# Patient Record
Sex: Female | Born: 1985 | Race: Black or African American | Hispanic: No | Marital: Single | Smoking: Former smoker
Health system: Southern US, Community
[De-identification: ages and names within clinical notes are randomized; demographics above are authoritative.]

## PROBLEM LIST (undated history)

## (undated) HISTORY — PX: TONSILLECTOMY: SUR1361

---
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
## 2007-10-29 ENCOUNTER — Ambulatory Visit (HOSPITAL_COMMUNITY): Admission: RE | Admit: 2007-10-29 | Discharge: 2007-10-29 | Payer: Self-pay | Admitting: Obstetrics and Gynecology

## 2007-11-19 ENCOUNTER — Ambulatory Visit (HOSPITAL_COMMUNITY): Admission: RE | Admit: 2007-11-19 | Discharge: 2007-11-19 | Payer: Self-pay | Admitting: Obstetrics and Gynecology

## 2007-12-09 ENCOUNTER — Ambulatory Visit (HOSPITAL_COMMUNITY): Admission: RE | Admit: 2007-12-09 | Discharge: 2007-12-09 | Payer: Self-pay | Admitting: Obstetrics and Gynecology

## 2008-01-06 ENCOUNTER — Ambulatory Visit (HOSPITAL_COMMUNITY): Admission: RE | Admit: 2008-01-06 | Discharge: 2008-01-06 | Payer: Self-pay | Admitting: Obstetrics and Gynecology

## 2008-03-01 ENCOUNTER — Ambulatory Visit (HOSPITAL_COMMUNITY): Admission: RE | Admit: 2008-03-01 | Discharge: 2008-03-01 | Payer: Self-pay | Admitting: Obstetrics and Gynecology

## 2010-11-03 ENCOUNTER — Encounter: Payer: Self-pay | Admitting: Obstetrics and Gynecology

## 2013-05-15 ENCOUNTER — Emergency Department (HOSPITAL_BASED_OUTPATIENT_CLINIC_OR_DEPARTMENT_OTHER): Payer: No Typology Code available for payment source

## 2013-05-15 ENCOUNTER — Encounter (HOSPITAL_BASED_OUTPATIENT_CLINIC_OR_DEPARTMENT_OTHER): Payer: Self-pay | Admitting: *Deleted

## 2013-05-15 ENCOUNTER — Emergency Department (HOSPITAL_BASED_OUTPATIENT_CLINIC_OR_DEPARTMENT_OTHER)
Admission: EM | Admit: 2013-05-15 | Discharge: 2013-05-15 | Disposition: A | Discharge status: Home / Self Care | Payer: No Typology Code available for payment source | Attending: Emergency Medicine | Admitting: Emergency Medicine

## 2013-05-15 DIAGNOSIS — Y939 Activity, unspecified: Secondary | ICD-10-CM | POA: Insufficient documentation

## 2013-05-15 DIAGNOSIS — Y92410 Unspecified street and highway as the place of occurrence of the external cause: Secondary | ICD-10-CM | POA: Insufficient documentation

## 2013-05-15 DIAGNOSIS — S8391XA Sprain of unspecified site of right knee, initial encounter: Secondary | ICD-10-CM

## 2013-05-15 DIAGNOSIS — F17200 Nicotine dependence, unspecified, uncomplicated: Secondary | ICD-10-CM | POA: Insufficient documentation

## 2013-05-15 DIAGNOSIS — IMO0002 Reserved for concepts with insufficient information to code with codable children: Secondary | ICD-10-CM | POA: Insufficient documentation

## 2013-05-15 DIAGNOSIS — S0990XA Unspecified injury of head, initial encounter: Secondary | ICD-10-CM | POA: Insufficient documentation

## 2013-05-15 MED ORDER — HYDROCODONE-ACETAMINOPHEN 5-325 MG PO TABS: 2.0000 | ORAL_TABLET | ORAL | Status: DC | PRN

## 2013-05-15 MED ORDER — HYDROCODONE-ACETAMINOPHEN 5-325 MG PO TABS
2.0000 | ORAL_TABLET | Freq: Once | ORAL | Status: AC
  Administered 2013-05-15: 2 via ORAL
  Filled 2013-05-15: qty 2

## 2013-05-15 NOTE — ED Notes (Signed)
Patient transported to X-ray 
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
## 2013-05-16 NOTE — ED Provider Notes (Signed)
  Medical screening examination/treatment/procedure(s) were performed by non-physician practitioner and as supervising physician I was immediately available for consultation/collaboration.    Gerhard Munch, MD 05/16/13 0000

## 2013-06-03 NOTE — ED Provider Notes (Signed)
  Medical screening examination/treatment/procedure(s) were performed by non-physician practitioner and as supervising physician I was immediately available for consultation/collaboration.    Gerhard Munch, MD 06/03/13 0005

## 2013-12-03 ENCOUNTER — Emergency Department (HOSPITAL_BASED_OUTPATIENT_CLINIC_OR_DEPARTMENT_OTHER)
Admission: EM | Admit: 2013-12-03 | Discharge: 2013-12-03 | Disposition: A | Discharge status: Home / Self Care | Payer: No Typology Code available for payment source | Attending: Emergency Medicine | Admitting: Emergency Medicine

## 2013-12-03 ENCOUNTER — Encounter (HOSPITAL_BASED_OUTPATIENT_CLINIC_OR_DEPARTMENT_OTHER): Payer: Self-pay | Admitting: Emergency Medicine

## 2013-12-03 DIAGNOSIS — F17200 Nicotine dependence, unspecified, uncomplicated: Secondary | ICD-10-CM | POA: Insufficient documentation

## 2013-12-03 DIAGNOSIS — J111 Influenza due to unidentified influenza virus with other respiratory manifestations: Secondary | ICD-10-CM

## 2013-12-03 LAB — RAPID STREP SCREEN (MED CTR MEBANE ONLY): STREPTOCOCCUS, GROUP A SCREEN (DIRECT): NEGATIVE

## 2013-12-03 MED ORDER — IBUPROFEN 800 MG PO TABS
800.0000 mg | ORAL_TABLET | Freq: Once | ORAL | Status: AC
  Administered 2013-12-03: 800 mg via ORAL

## 2013-12-03 MED ORDER — HYDROCODONE-ACETAMINOPHEN 7.5-325 MG/15ML PO SOLN: ORAL | Status: DC

## 2013-12-03 MED ORDER — IBUPROFEN 800 MG PO TABS
ORAL_TABLET | ORAL | Status: AC
  Filled 2013-12-03: qty 1

## 2013-12-03 MED ORDER — GUAIFENESIN 100 MG/5ML PO LIQD: 100.0000 mg | ORAL | Status: DC | PRN

## 2013-12-03 MED ORDER — OSELTAMIVIR PHOSPHATE 75 MG PO CAPS: 75.0000 mg | ORAL_CAPSULE | Freq: Two times a day (BID) | ORAL | Status: DC

## 2013-12-03 NOTE — Discharge Instructions (Signed)

## 2013-12-03 NOTE — ED Provider Notes (Signed)
Medical screening examination/treatment/procedure(s) were performed by non-physician practitioner and as supervising physician I was immediately available for consultation/collaboration.  EKG Interpretation   None         Harvie Morua, MD 12/03/13 2244 

## 2013-12-03 NOTE — ED Notes (Signed)
C/o sore throat, fever, body aches, one episode of vomiting, congestion.  Daughter has been diagnosed with the flu.

## 2013-12-03 NOTE — ED Provider Notes (Signed)
CSN: [id number]     Arrival date & time 12/03/13  1809 History   First MD Initiated Contact with Patient 12/03/13 1911     Chief Complaint  Patient presents with  . Sore Throat, Body Aches      (Consider location/radiation/quality/duration/timing/severity/associated sxs/prior Treatment) HPI  28 year old female presents with flulike symptoms. Patient reports for the past 2 days she experiencing fever, chills, headache, sore throat, nasal congestion, sneezing, cough, body aches, and episodes of vomiting after persistent coughing. Patient is taking Goody powder and Tylenol with some relief. Patient states her daughter was recently diagnosed with flu with a positive flu test and receive Tamiflu which helps with the symptoms. States her son in the mom was diagnosed with positive influenza as well. She is the only one that did not take any TamiFlu.  She denies any recent travel. Denies any neck stiffness, shortness of breath, hemoptysis, abdominal pain, dysuria, or rash.  History reviewed. No pertinent past medical history. Past Surgical History  Procedure Laterality Date  . Tonsillectomy     No family history on file. History  Substance Use Topics  . Smoking status: Current Every Day Smoker  . Smokeless tobacco: Not on file  . Alcohol Use: No   OB History   Grav Para Term Preterm Abortions TAB SAB Ect Mult Living                 Review of Systems  All other systems reviewed and are negative.      Allergies  Review of patient's allergies indicates no known allergies.  Home Medications   Current Outpatient Rx  Name  Route  Sig  Dispense  Refill  . HYDROcodone-acetaminophen (NORCO/VICODIN) 5-325 MG per tablet   Oral   Take 2 tablets by mouth every 4 (four) hours as needed.   16 tablet   0    BP 132/83  Pulse 108  Temp(Src) 100.8 F (38.2 C) (Oral)  Resp 20  Ht 5\' 8"  (1.727 m)  Wt 240 lb (108.863 kg)  BMI 36.50 kg/m2  SpO2 99%  LMP 11/09/2013 Physical Exam   Nursing note and vitals reviewed. Constitutional: She is oriented to person, place, and time. She appears well-developed and well-nourished. No distress.  Awake, alert, nontoxic appearance  HENT:  Head: Atraumatic.  Right Ear: External ear normal.  Left Ear: External ear normal.  Nose: Nose normal.  Mouth/Throat: Oropharynx is clear and moist.  Eyes: Conjunctivae are normal. Right eye exhibits no discharge. Left eye exhibits no discharge.  Neck: Neck supple.  Cardiovascular: Normal rate and regular rhythm.   Pulmonary/Chest: Effort normal. No respiratory distress. She has no wheezes. She has no rales. She exhibits no tenderness.  Abdominal: Soft. There is no tenderness. There is no rebound.  Musculoskeletal: She exhibits no tenderness.  Neurological: She is alert and oriented to person, place, and time.  Skin: No rash noted.  Psychiatric: She has a normal mood and affect.    ED Course  Procedures (including critical care time)  Patient here with flulike symptoms. Considering that patient has recent sick contact with positive flu test and she is within the 48-hour window, will prescribe Tamiflu along with guaifenesin and Hycet cough medication. Work note provided.  Labs Review Labs Reviewed  RAPID STREP SCREEN  CULTURE, GROUP A STREP   Imaging Review No results found.  EKG Interpretation   None       MDM   Final diagnoses:  Influenza    BP 118/60  Pulse  94  Temp(Src) 100.7 F (38.2 C) (Oral)  Resp 16  Ht 5\' 8"  (1.727 m)  Wt 240 lb (108.863 kg)  BMI 36.50 kg/m2  SpO2 100%  LMP 11/09/2013     Fayrene HelperBowie Gillian Meeuwsen, PA-C 12/03/13 2042

## 2013-12-05 LAB — CULTURE, GROUP A STREP

## 2018-07-14 ENCOUNTER — Encounter (HOSPITAL_BASED_OUTPATIENT_CLINIC_OR_DEPARTMENT_OTHER): Payer: Self-pay | Admitting: *Deleted

## 2018-07-14 ENCOUNTER — Emergency Department (HOSPITAL_BASED_OUTPATIENT_CLINIC_OR_DEPARTMENT_OTHER)
Admission: EM | Admit: 2018-07-14 | Discharge: 2018-07-15 | Disposition: A | Discharge status: Home / Self Care | Payer: Medicaid Other | Attending: Emergency Medicine | Admitting: Emergency Medicine

## 2018-07-14 ENCOUNTER — Emergency Department (HOSPITAL_BASED_OUTPATIENT_CLINIC_OR_DEPARTMENT_OTHER): Payer: Medicaid Other

## 2018-07-14 ENCOUNTER — Emergency Department (HOSPITAL_COMMUNITY): Payer: Medicaid Other

## 2018-07-14 ENCOUNTER — Other Ambulatory Visit: Payer: Self-pay

## 2018-07-14 DIAGNOSIS — F17210 Nicotine dependence, cigarettes, uncomplicated: Secondary | ICD-10-CM | POA: Diagnosis not present

## 2018-07-14 DIAGNOSIS — N939 Abnormal uterine and vaginal bleeding, unspecified: Secondary | ICD-10-CM

## 2018-07-14 DIAGNOSIS — R102 Pelvic and perineal pain: Secondary | ICD-10-CM | POA: Insufficient documentation

## 2018-07-14 NOTE — ED Provider Notes (Signed)
[facility] EMERGENCY DEPARTMENT Provider Note   CSN: [id number] Arrival date & time: 07/14/18  2045     History   Chief Complaint Chief Complaint  Patient presents with  . Vaginal Pain    HPI Latasha Lee is a 32 y.o. female.  32 y/o female G2P3 with no PMH presents to the ED with a chief complaint of vaginal bleeding and pain x since this afternoon.  States she has had vaginal bleeding for the past couple of months and she was seen by an OB/GYN who actually place patient on medication however patient states that this medication was making her bleed even more so she stopped the medication.  Patient had a ParaGard IUD placed 9 years ago and reports this IUD fell out today when she went to pull out her tampon while she was pain in the toilet at work.  She also endorses some cramping all along her lower abdomen, patient has taken Tylenol for relieve and states this has help with her cramps.  She was seen by an OB/GYN back in July who advised her that her vaginal bleeding will improve with time.  She states she is very alarmed as her IUD fell out and there were clots on the IUD when they came out.  She denies any fever, vaginal discharge, shortness of breath or chest pain.     History reviewed. No pertinent past medical history.  There are no active problems to display for this patient.   Past Surgical History:  Procedure Laterality Date  . TONSILLECTOMY       OB History   None      Home Medications    Prior to Admission medications   Not on File    Family History History reviewed. No pertinent family history.  Social History Social History   Tobacco Use  . Smoking status: Current Every Day Smoker    Packs/day: 0.50  . Smokeless tobacco: Never Used  Substance Use Topics  . Alcohol use: No  . Drug use: No     Allergies   Patient has no known allergies.   Review of Systems Review of Systems  Constitutional: Negative for fever.    Gastrointestinal: Negative for abdominal pain and vomiting.  Genitourinary: Positive for vaginal bleeding and vaginal pain. Negative for dysuria, flank pain and vaginal discharge.  All other systems reviewed and are negative.    Physical Exam Updated Vital Signs BP 134/84 (BP Location: Right Arm)   Pulse 72   Temp 98 F (36.7 C) (Oral)   Resp 18   Ht 5\' 7"  (1.702 m)   Wt (!) 137 kg   LMP 05/09/2018   SpO2 98%   BMI 47.30 kg/m   Physical Exam  Constitutional: She is oriented to person, place, and time. She appears well-developed and well-nourished.  HENT:  Head: Normocephalic and atraumatic.  Neck: Normal range of motion. Neck supple.  Cardiovascular: Normal heart sounds.  Pulmonary/Chest: Breath sounds normal.  Abdominal: Soft. There is tenderness (lower region).  Musculoskeletal: She exhibits no tenderness.  Neurological: She is alert and oriented to person, place, and time.  Skin: Skin is warm and dry.  Nursing note and vitals reviewed.    ED Treatments / Results  Labs (all labs ordered are listed, but only abnormal results are displayed) Labs Reviewed - No data to display  EKG None  Radiology US Pelvis Transvanginal Non-ob (tv Only)  Result Date: 07/14/2018 CLINICAL DATA:  Vaginal pain status post unintentional IUD removal  EXAM: ULTRASOUND PELVIS TRANSVAGINAL TECHNIQUE: Transvaginal ultrasound examination of the pelvis was performed including evaluation of the uterus, ovaries, adnexal regions, and pelvic cul-de-sac. COMPARISON:  None. FINDINGS: Uterus Measurements: 8.2 x 4.4 x 5.0 cm. No fibroids or other mass visualized. Endometrium Thickness: 13 mm.  No focal abnormality visualized. Right ovary Measurements: 3.6 x 2.2 x 2.2 cm. Normal appearance/no adnexal mass. Left ovary Measurements: 2.9 x 2.3 x 2.3 cm. Normal appearance/no adnexal mass. Other findings:  No abnormal free fluid IMPRESSION: Negative pelvic ultrasound. Electronically Signed   By: Charline Bills  M.D.   On: 07/14/2018 23:33    Procedures Procedures (including critical care time)  Medications Ordered in ED Medications - No data to display   Initial Impression / Assessment and Plan / ED Course  I have reviewed the triage vital signs and the nursing notes.  Pertinent labs & imaging results that were available during my care of the patient were reviewed by me and considered in my medical decision making (see chart for details).     She presents with vaginal bleeding and vaginal pain.  Patient used to bathroom today and states while she was urinating and removing her tampon she saw her IUD come out.  Patient showed me a picture of her IUD cover and clots at this time.  Patient is afraid that she might have perforated or have some irregular issues with her cervix and uterus.  Patient's mother at the bedside requesting ultrasound to rule out any acute abnormalities.  Patient states that she is been bleeding for a couple months but states that her GYN placed her on a medication which actually decrease her bleeding and then increased it, she has stopped taking his medication as of now.  Ultrasound pelvis transvaginal showed ovaries are within normal limits, there is no adnexal masses, no acute abnormality at this time.  Negative pelvic ultrasound  At this time I spoken to patient and ask her to move up her appointment to her gynecologist but she reports she has an appointment with him in October 17.  Patient will continue to take ibuprofen or Aleve for her pain.  Return precautions have been discussed with patient and mother at the bedside at length.  Vitals stable during ED visit, patient stable for discharge.  Final Clinical Impressions(s) / ED Diagnoses   Final diagnoses:  Vaginal pain  Vaginal bleeding    ED Discharge Orders    None       Claude Manges, Cordelia Poche 07/14/18 2349    Raeford Razor, MD 07/19/18 [phone]

## 2018-07-14 NOTE — ED Triage Notes (Signed)
Pt c/o vaginal pain after IUD fell out x 1 hr ago after taking out tampon

## 2018-07-14 NOTE — Discharge Instructions (Addendum)
Ultrasound today was normal.  Please follow up with GYN, you may call tomorrow in order to try moving your appointment to sooner date.

## 2019-05-12 IMAGING — US US TRANSVAGINAL NON-OB
1 series · 14 of 25 positions shown · non-contrast
Comparison: None.

CLINICAL DATA: Vaginal pain status post unintentional IUD removal

EXAM:
ULTRASOUND PELVIS TRANSVAGINAL
TECHNIQUE: Transvaginal ultrasound examination of the pelvis was performed
including evaluation of the uterus, ovaries, adnexal regions, and
pelvic cul-de-sac.

[Series 1: us transvaginal non-ob · 0.12mm/px · 14 of 32 slices shown]
[im 1/32]
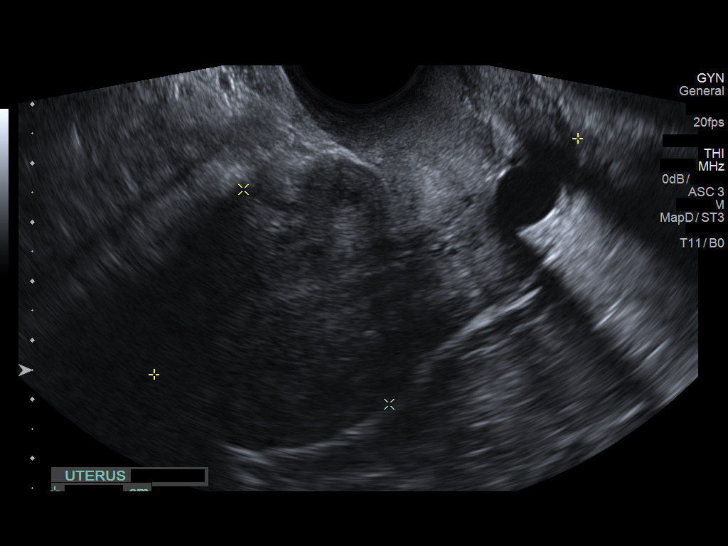
[im 3/32]
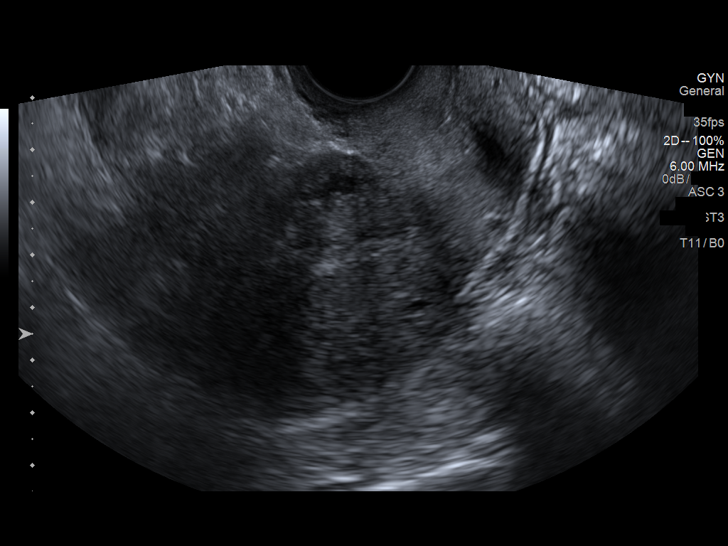
[im 6/32]
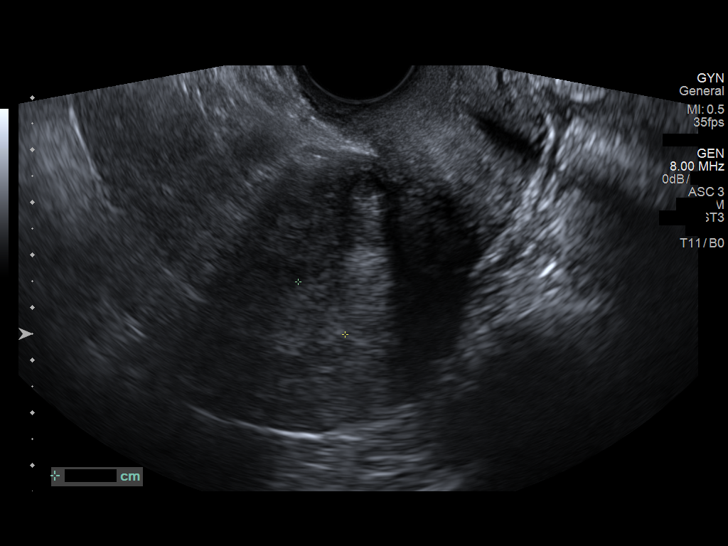
[im 8/32]
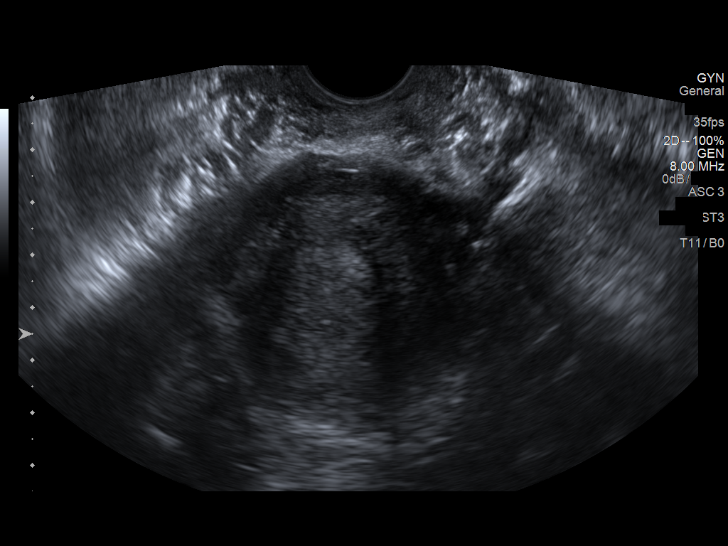
[im 11/32]
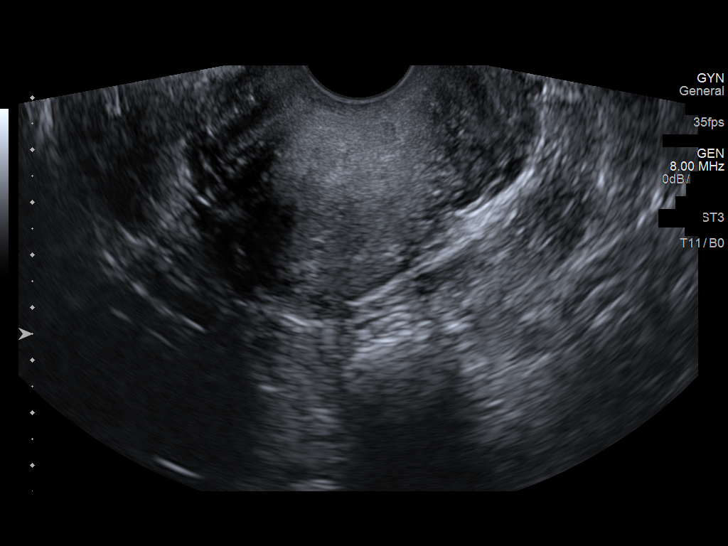
[im 12/32]
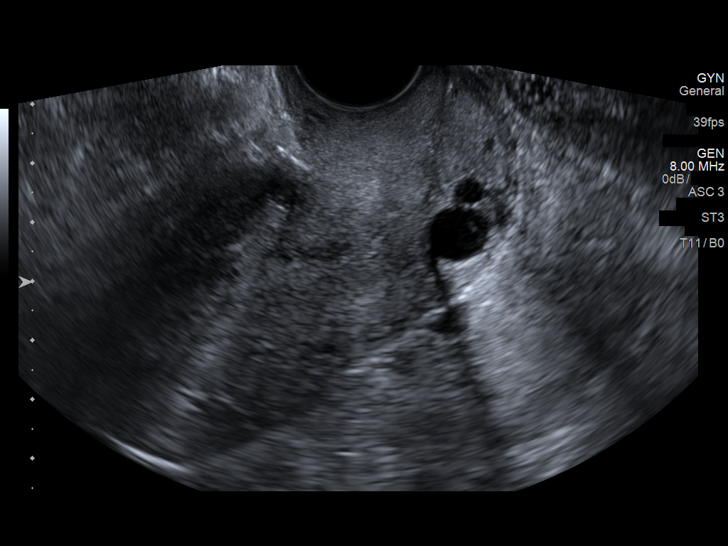
[im 15/32]
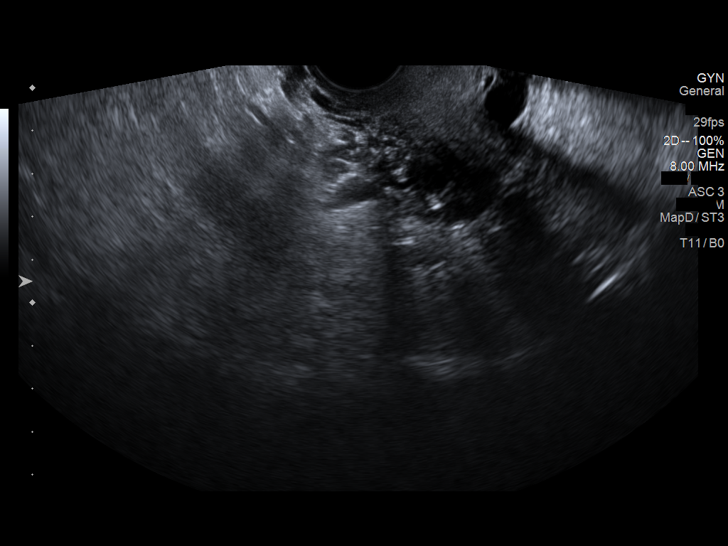
[im 17/32]
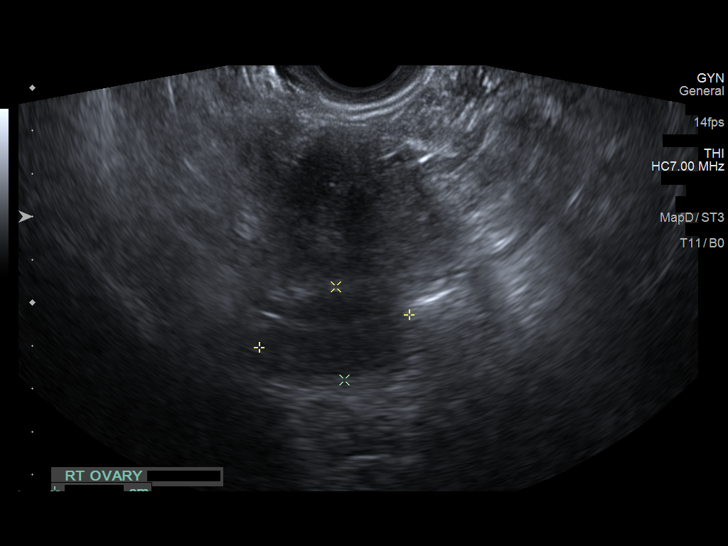
[im 20/32]
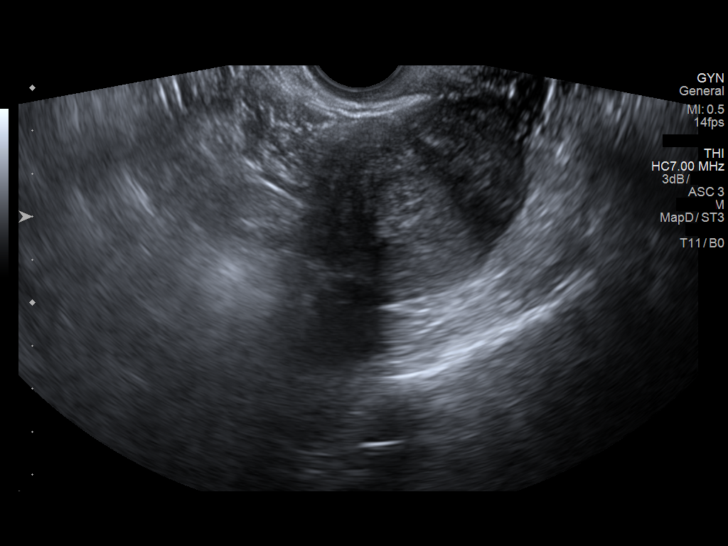
[im 21/32]
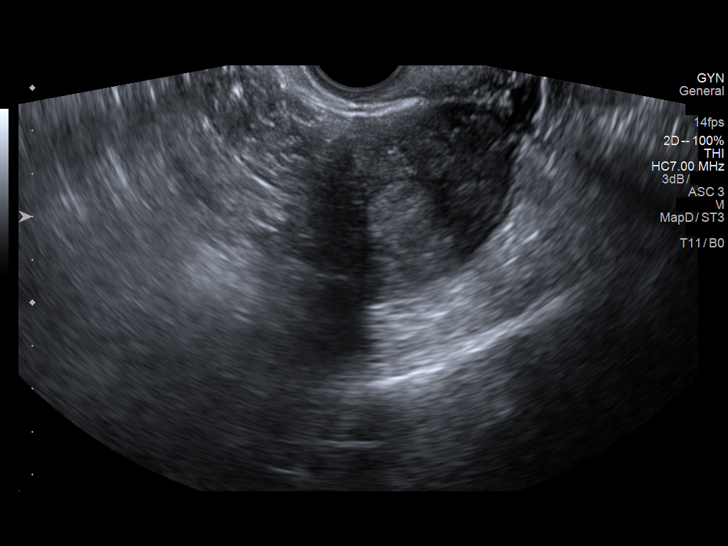
[im 24/32]
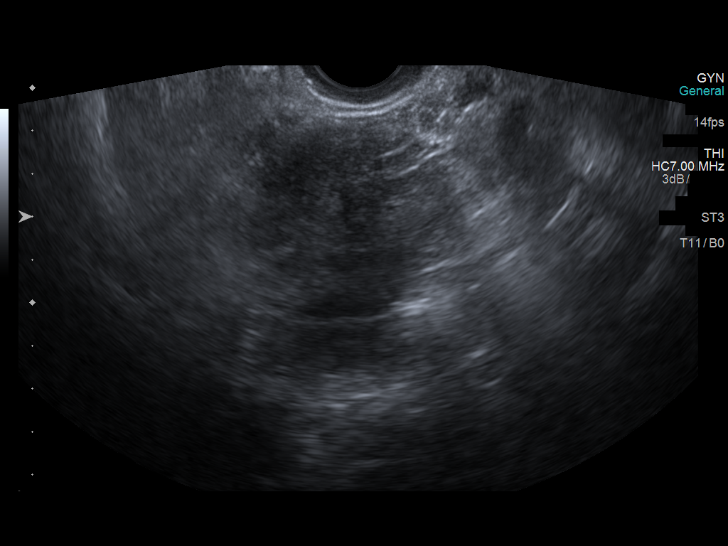
[im 26/32]
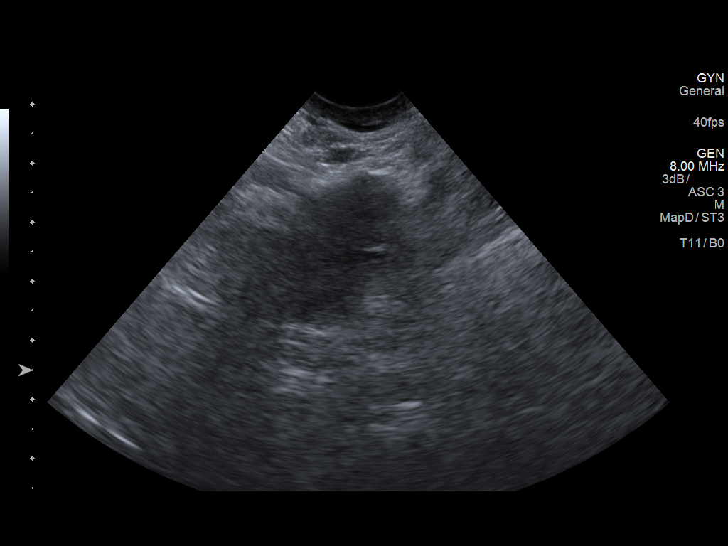
[im 29/32]
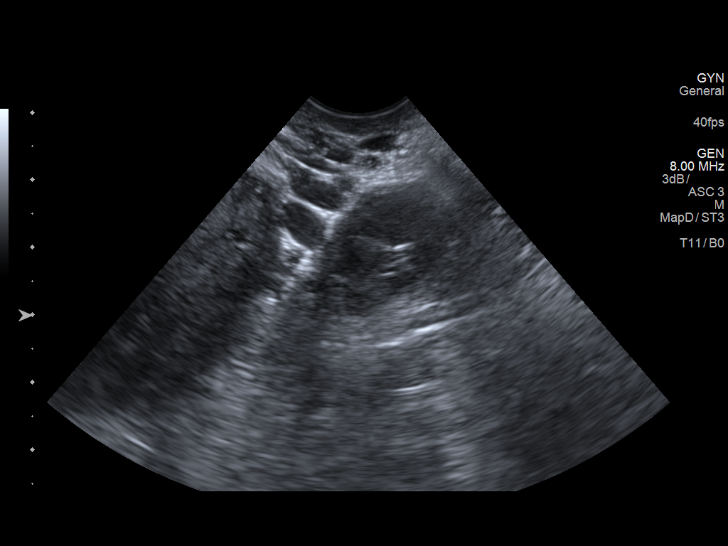
[im 32/32]
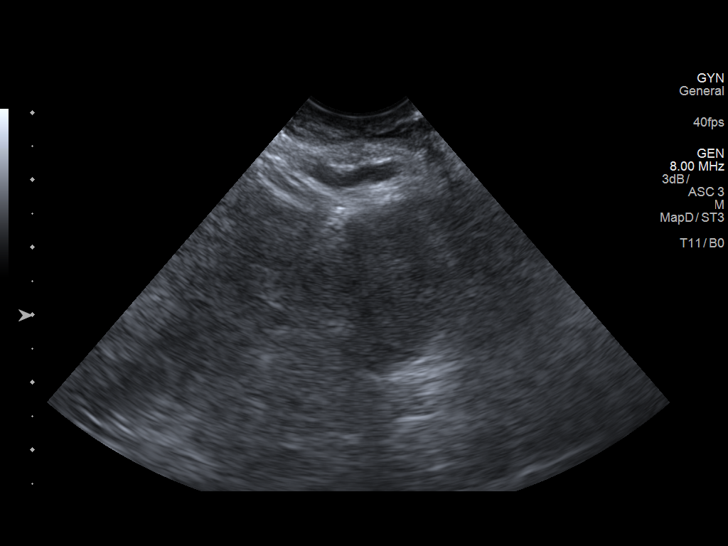

[14 of 25 positions shown; findings below may reference images not displayed]

FINDINGS: Uterus

Measurements: 8.2 x 4.4 x 5.0 cm. No fibroids or other mass
visualized.

Endometrium

Thickness: 13 mm.  No focal abnormality visualized.

Right ovary

Measurements: 3.6 x 2.2 x 2.2 cm. Normal appearance/no adnexal mass.

Left ovary

Measurements: 2.9 x 2.3 x 2.3 cm. Normal appearance/no adnexal mass.

Other findings:  No abnormal free fluid
IMPRESSION: Negative pelvic ultrasound.

## 2019-12-05 ENCOUNTER — Emergency Department (HOSPITAL_BASED_OUTPATIENT_CLINIC_OR_DEPARTMENT_OTHER)
Admission: EM | Admit: 2019-12-05 | Discharge: 2019-12-05 | Disposition: A | Discharge status: Home / Self Care | Payer: Medicaid Other | Attending: Emergency Medicine | Admitting: Emergency Medicine

## 2019-12-05 ENCOUNTER — Encounter (HOSPITAL_BASED_OUTPATIENT_CLINIC_OR_DEPARTMENT_OTHER): Payer: Self-pay | Admitting: *Deleted

## 2019-12-05 ENCOUNTER — Other Ambulatory Visit: Payer: Self-pay

## 2019-12-05 DIAGNOSIS — Z87891 Personal history of nicotine dependence: Secondary | ICD-10-CM | POA: Insufficient documentation

## 2019-12-05 DIAGNOSIS — O26899 Other specified pregnancy related conditions, unspecified trimester: Secondary | ICD-10-CM | POA: Diagnosis not present

## 2019-12-05 DIAGNOSIS — R1030 Lower abdominal pain, unspecified: Secondary | ICD-10-CM | POA: Diagnosis not present

## 2019-12-05 DIAGNOSIS — R109 Unspecified abdominal pain: Secondary | ICD-10-CM

## 2019-12-05 DIAGNOSIS — Z3A35 35 weeks gestation of pregnancy: Secondary | ICD-10-CM | POA: Diagnosis not present

## 2019-12-05 LAB — COMPREHENSIVE METABOLIC PANEL
ALT: 17 U/L (ref 0–44)
AST: 15 U/L (ref 15–41)
Albumin: 3 g/dL — ABNORMAL LOW (ref 3.5–5.0)
Alkaline Phosphatase: 74 U/L (ref 38–126)
Anion gap: 8 (ref 5–15)
BUN: 9 mg/dL (ref 6–20)
CO2: 24 mmol/L (ref 22–32)
Calcium: 9.5 mg/dL (ref 8.9–10.3)
Chloride: 101 mmol/L (ref 98–111)
Creatinine, Ser: 0.62 mg/dL (ref 0.44–1.00)
GFR calc Af Amer: >60 mL/min (ref >60)
GFR calc non Af Amer: >60 mL/min (ref >60)
Glucose, Bld: 90 mg/dL (ref 70–99)
Potassium: 3.7 mmol/L (ref 3.5–5.1)
Sodium: 133 mmol/L — ABNORMAL LOW (ref 135–145)
Total Bilirubin: 0.3 mg/dL (ref 0.3–1.2)
Total Protein: 7.4 g/dL (ref 6.5–8.1)

## 2019-12-05 LAB — CBC WITH DIFFERENTIAL/PLATELET
Abs Immature Granulocytes: 0.05 10*3/uL (ref 0.00–0.07)
Basophils Absolute: 0 10*3/uL (ref 0.0–0.1)
Basophils Relative: 0 %
Eosinophils Absolute: 0.2 10*3/uL (ref 0.0–0.5)
Eosinophils Relative: 2 %
HCT: 32.7 % — ABNORMAL LOW (ref 36.0–46.0)
Hemoglobin: 10.7 g/dL — ABNORMAL LOW (ref 12.0–15.0)
Immature Granulocytes: 1 %
Lymphocytes Relative: 24 %
Lymphs Abs: 1.9 10*3/uL (ref 0.7–4.0)
MCH: 29.3 pg (ref 26.0–34.0)
MCHC: 32.7 g/dL (ref 30.0–36.0)
MCV: 89.6 fL (ref 80.0–100.0)
Monocytes Absolute: 0.8 10*3/uL (ref 0.1–1.0)
Monocytes Relative: 10 %
Neutro Abs: 4.9 10*3/uL (ref 1.7–7.7)
Neutrophils Relative %: 63 %
Platelets: 347 10*3/uL (ref 150–400)
RBC: 3.65 MIL/uL — ABNORMAL LOW (ref 3.87–5.11)
RDW: 13.1 % (ref 11.5–15.5)
WBC: 7.8 10*3/uL (ref 4.0–10.5)
nRBC: 0 % (ref 0.0–0.2)

## 2019-12-05 LAB — URINALYSIS, MICROSCOPIC (REFLEX)

## 2019-12-05 LAB — URINALYSIS, ROUTINE W REFLEX MICROSCOPIC
Bilirubin Urine: NEGATIVE
Glucose, UA: NEGATIVE mg/dL
Hgb urine dipstick: NEGATIVE
Ketones, ur: 40 mg/dL — AB
Leukocytes,Ua: NEGATIVE
Nitrite: NEGATIVE
Protein, ur: 30 mg/dL — AB
Specific Gravity, Urine: >1.03 — ABNORMAL HIGH (ref 1.005–1.030)
pH: 6.5 (ref 5.0–8.0)

## 2019-12-05 MED ORDER — SODIUM CHLORIDE 0.9 % IV BOLUS
1000.0000 mL | Freq: Once | INTRAVENOUS | Status: AC
  Administered 2019-12-05: 1000 mL via INTRAVENOUS

## 2019-12-05 NOTE — ED Notes (Signed)
Latasha Lee OB rapis response notified

## 2019-12-05 NOTE — ED Notes (Signed)
Latasha Lee OB reports cleared by [facility] MD OB attending on call. Pt removed from [facility] monitor

## 2019-12-05 NOTE — ED Notes (Signed)
ED Provider at bedside.  Pelvic exam 

## 2019-12-05 NOTE — ED Provider Notes (Signed)
[facility] Emergency Department Provider Note MRN:  [id number]  Arrival date & time: 12/05/19     Chief Complaint   [redacted] Weeks Pregnant and Abdominal Pain   History of Present Illness   Latasha Lee is a 34 y.o. year-old female with no pertinent past medical history presenting to the ED with chief complaint of abdominal pain.  Location: Diffuse lower pain Duration: 1 hour Onset: Sudden Timing: Constant Description: Squeezing Severity: Mild Exacerbating/Alleviating Factors: None Associated Symptoms: None Pertinent Negatives: Denies vaginal bleeding or leakage of fluid, no fever, no vomiting, no diarrhea, no chest pain, no shortness of breath.   Review of Systems  A complete 10 system review of systems was obtained and all systems are negative except as noted in the HPI and PMH.   Patient's Health History   History reviewed. No pertinent past medical history.  Past Surgical History:  Procedure Laterality Date  . TONSILLECTOMY      No family history on file.  Social History   Socioeconomic History  . Marital status: Single    Spouse name: Not on file  . Number of children: Not on file  . Years of education: Not on file  . Highest education level: Not on file  Occupational History  . Not on file  Tobacco Use  . Smoking status: Former Smoker    Packs/day: 0.50  . Smokeless tobacco: Never Used  Substance and Sexual Activity  . Alcohol use: No  . Drug use: No  . Sexual activity: Not on file  Other Topics Concern  . Not on file  Social History Narrative  . Not on file   Social Determinants of Health   Financial Resource Strain:   . Difficulty of Paying Living Expenses: Not on file  Food Insecurity:   . Worried About Charity fundraiser in the Last Year: Not on file  . Ran Out of Food in the Last Year: Not on file  Transportation Needs:   . Lack of Transportation (Medical): Not on file  . Lack of Transportation (Non-Medical): Not  on file  Physical Activity:   . Days of Exercise per Week: Not on file  . Minutes of Exercise per Session: Not on file  Stress:   . Feeling of Stress : Not on file  Social Connections:   . Frequency of Communication with Friends and Family: Not on file  . Frequency of Social Gatherings with Friends and Family: Not on file  . Attends Religious Services: Not on file  . Active Member of Clubs or Organizations: Not on file  . Attends Archivist Meetings: Not on file  . Marital Status: Not on file  Intimate Partner Violence:   . Fear of Current or Ex-Partner: Not on file  . Emotionally Abused: Not on file  . Physically Abused: Not on file  . Sexually Abused: Not on file     Physical Exam   Vitals:   12/05/19 1910  BP: 131/78  Pulse: 82  Resp: 16  Temp: 98.2 F (36.8 C)  SpO2: 100%    CONSTITUTIONAL: Well-appearing, NAD NEURO:  Alert and oriented x 3, no focal deficits EYES:  eyes equal and reactive ENT/NECK:  no LAD, no JVD CARDIO: Regular rate, well-perfused, normal S1 and S2 PULM:  CTAB no wheezing or rhonchi GI/GU:  normal bowel sounds, non-distended, non-tender; cervical exam reveals a very high and difficult to reach her cervix that is minimally dilated MSK/SPINE:  No gross deformities, no  edema SKIN:  no rash, atraumatic PSYCH:  Appropriate speech and behavior  *Additional and/or pertinent findings included in MDM below  Diagnostic and Interventional Summary    EKG Interpretation  Date/Time:    Ventricular Rate:    PR Interval:    QRS Duration:   QT Interval:    QTC Calculation:   R Axis:     Text Interpretation:        Cardiac Monitoring Interpretation:  Labs Reviewed  URINALYSIS, ROUTINE W REFLEX MICROSCOPIC - Abnormal; Notable for the following components:      Result Value   APPearance CLOUDY (*)    Specific Gravity, Urine >1.030 (*)    Ketones, ur 40 (*)    Protein, ur 30 (*)    All other components within normal limits  CBC WITH  DIFFERENTIAL/PLATELET - Abnormal; Notable for the following components:   RBC 3.65 (*)    Hemoglobin 10.7 (*)    HCT 32.7 (*)    All other components within normal limits  COMPREHENSIVE METABOLIC PANEL - Abnormal; Notable for the following components:   Sodium 133 (*)    Albumin 3.0 (*)    All other components within normal limits  URINALYSIS, MICROSCOPIC (REFLEX) - Abnormal; Notable for the following components:   Bacteria, UA MANY (*)    All other components within normal limits    No orders to display    Medications  sodium chloride 0.9 % bolus 1,000 mL (0 mLs Intravenous Stopped 12/05/19 2014)     Procedures  /  Critical Care Procedures  ED Course and Medical Decision Making  I have reviewed the triage vital signs, the nursing notes, and pertinent available records from the EMR.  Pertinent labs & imaging results that were available during my care of the patient were reviewed by me and considered in my medical decision making (see below for details).     Suspect Braxton Hicks contraction, abdomen is nontender, vitals are stable, will monitor with toco.  Normal fetal heart rate.  Anticipate discharge or transfer to MAU.  8:30 PM update: Patient was evaluated on the toco monitoring for 30 minutes and OB was consulted.  No reason for transfer, patient can be discharged home per OB recommendations.  Urinalysis shows bacteria but many squamous cells, favored to be contaminated, will hold off on treatment at this time.  Elmer Sow. Pilar Plate, MD [facility] Emergency Medicine [facility] [email or web address]  Final Clinical Impressions(s) / ED Diagnoses     ICD-10-CM   1. Abdominal pain, unspecified abdominal location  R10.9     ED Discharge Orders    None       Discharge Instructions Discussed with and Provided to Patient:     Discharge Instructions     You were evaluated in the Emergency Department and after careful evaluation, we did not find any emergent  condition requiring admission or further testing in the hospital.  Your exam/testing today was overall reassuring.  Please return to the Emergency Department if you experience any worsening of your condition.  We encourage you to follow up with a primary care provider.  Thank you for allowing [place] to be a part of your care.        Sabas Sous, MD 12/05/19 2029

## 2019-12-05 NOTE — Progress Notes (Addendum)
[facility] RN called about [redacted]w[redacted]d G3P3.  Pt has history of twins. Followed by [place] [facility],  Pt c/o tightening in lower right abdomen, starting an hour ago, constant stretching and cramping.  No bleeding or leaking of fluid, cervix fingertip and high by EDP.  Primary RN placing EFM monitors right now, will assess strip and discuss with attending. Addendum 2020 - difficulty with EFM due to anterior placenta.  On phone call with primary RN sounds like fetal heart rate as opposed to maternal heart rate.  Tracing spotty but good variability and accels noted.  Explained situation to Dr. Despina Hidden, MD cleared pt from [facility] standpoint for discharge.

## 2019-12-05 NOTE — Discharge Instructions (Addendum)
You were evaluated in the Emergency Department and after careful evaluation, we did not find any emergent condition requiring admission or further testing in the hospital. ° °Your exam/testing today was overall reassuring. ° °Please return to the Emergency Department if you experience any worsening of your condition.  We encourage you to follow up with a primary care provider.  Thank you for allowing us to be a part of your care. ° °

## 2019-12-05 NOTE — ED Triage Notes (Signed)
She is [redacted] weeks pregnant. C.o abdominal pain for about an hour. Constant pain when she stands. Dull pain while sitting. Her OBGYN is in [place]. She plans to deliver at [facility].
